# Patient Record
Sex: Male | Born: 1970 | Race: White | Hispanic: No | Marital: Married | Smoking: Current every day smoker
Health system: Southern US, Community
[De-identification: ages and names within clinical notes are randomized; demographics above are authoritative.]

## PROBLEM LIST (undated history)

## (undated) DIAGNOSIS — I10 Essential (primary) hypertension: Secondary | ICD-10-CM

## (undated) DIAGNOSIS — E119 Type 2 diabetes mellitus without complications: Secondary | ICD-10-CM

## (undated) DIAGNOSIS — E785 Hyperlipidemia, unspecified: Secondary | ICD-10-CM

## (undated) HISTORY — DX: Essential (primary) hypertension: I10

## (undated) HISTORY — DX: Type 2 diabetes mellitus without complications: E11.9

## (undated) HISTORY — DX: Hyperlipidemia, unspecified: E78.5

---

## 2013-09-27 ENCOUNTER — Ambulatory Visit: Payer: Self-pay | Admitting: Family Medicine

## 2015-07-02 ENCOUNTER — Ambulatory Visit (INDEPENDENT_AMBULATORY_CARE_PROVIDER_SITE_OTHER): Payer: BLUE CROSS/BLUE SHIELD | Admitting: Family Medicine

## 2015-07-02 ENCOUNTER — Encounter: Payer: Self-pay | Admitting: Family Medicine

## 2015-07-02 ENCOUNTER — Ambulatory Visit (INDEPENDENT_AMBULATORY_CARE_PROVIDER_SITE_OTHER): Payer: BLUE CROSS/BLUE SHIELD

## 2015-07-02 DIAGNOSIS — M549 Dorsalgia, unspecified: Secondary | ICD-10-CM | POA: Diagnosis not present
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
## 2018-09-06 ENCOUNTER — Ambulatory Visit: Payer: BLUE CROSS/BLUE SHIELD | Admitting: Family
